# Patient Record
Sex: Female | Born: 1975 | Hispanic: Yes | Marital: Married | Smoking: Never smoker
Health system: Southern US, Community
[De-identification: ages and names within clinical notes are randomized; demographics above are authoritative.]

## PROBLEM LIST (undated history)

## (undated) DIAGNOSIS — E7800 Pure hypercholesterolemia, unspecified: Secondary | ICD-10-CM

## (undated) DIAGNOSIS — E559 Vitamin D deficiency, unspecified: Secondary | ICD-10-CM

---

## 2004-03-16 ENCOUNTER — Ambulatory Visit: Payer: Self-pay | Admitting: Family Medicine

## 2004-09-19 ENCOUNTER — Emergency Department: Payer: Self-pay | Admitting: Emergency Medicine

## 2013-07-10 ENCOUNTER — Emergency Department: Payer: Self-pay | Admitting: Emergency Medicine

## 2013-07-10 LAB — COMPREHENSIVE METABOLIC PANEL
ALBUMIN: 4 g/dL (ref 3.4–5.0)
ALK PHOS: 75 U/L
ANION GAP: 8 (ref 7–16)
BUN: 11 mg/dL (ref 7–18)
Bilirubin,Total: 0.6 mg/dL (ref 0.2–1.0)
Calcium, Total: 8.9 mg/dL (ref 8.5–10.1)
Chloride: 103 mmol/L (ref 98–107)
Co2: 28 mmol/L (ref 21–32)
Creatinine: 0.69 mg/dL (ref 0.60–1.30)
EGFR (Non-African Amer.): >60
Glucose: 99 mg/dL (ref 65–99)
Osmolality: 277 (ref 275–301)
Potassium: 3.7 mmol/L (ref 3.5–5.1)
SGOT(AST): 28 U/L (ref 15–37)
SGPT (ALT): 44 U/L (ref 12–78)
Sodium: 139 mmol/L (ref 136–145)
Total Protein: 8.1 g/dL (ref 6.4–8.2)

## 2013-07-10 LAB — CBC WITH DIFFERENTIAL/PLATELET
BASOS PCT: 0.4 %
Basophil #: 0 10*3/uL (ref 0.0–0.1)
EOS ABS: 0.1 10*3/uL (ref 0.0–0.7)
EOS PCT: 1.1 %
HCT: 40.6 % (ref 35.0–47.0)
HGB: 14 g/dL (ref 12.0–16.0)
LYMPHS PCT: 23.5 %
Lymphocyte #: 1.6 10*3/uL (ref 1.0–3.6)
MCH: 29.2 pg (ref 26.0–34.0)
MCHC: 34.5 g/dL (ref 32.0–36.0)
MCV: 85 fL (ref 80–100)
MONOS PCT: 7.8 %
Monocyte #: 0.5 x10 3/mm (ref 0.2–0.9)
NEUTROS PCT: 67.2 %
Neutrophil #: 4.7 10*3/uL (ref 1.4–6.5)
Platelet: 226 10*3/uL (ref 150–440)
RBC: 4.81 10*6/uL (ref 3.80–5.20)
RDW: 12.9 % (ref 11.5–14.5)
WBC: 7 10*3/uL (ref 3.6–11.0)

## 2013-07-10 LAB — URINALYSIS, COMPLETE
BLOOD: NEGATIVE
Bacteria: NEGATIVE
Bilirubin,UR: NEGATIVE
Glucose,UR: NEGATIVE mg/dL (ref 0–75)
KETONE: NEGATIVE
LEUKOCYTE ESTERASE: NEGATIVE
NITRITE: NEGATIVE
Ph: 6 (ref 4.5–8.0)
Protein: NEGATIVE
RBC, UR: NONE SEEN /HPF (ref 0–5)
Specific Gravity: 1.01 (ref 1.003–1.030)

## 2014-06-19 ENCOUNTER — Emergency Department: Payer: Self-pay

## 2014-06-19 ENCOUNTER — Emergency Department
Admission: EM | Admit: 2014-06-19 | Discharge: 2014-06-19 | Disposition: A | Discharge status: Home / Self Care | Payer: Self-pay | Attending: Emergency Medicine | Admitting: Emergency Medicine

## 2014-06-19 DIAGNOSIS — R519 Headache, unspecified: Secondary | ICD-10-CM

## 2014-06-19 DIAGNOSIS — G510 Bell's palsy: Secondary | ICD-10-CM | POA: Insufficient documentation

## 2014-06-19 DIAGNOSIS — R51 Headache: Secondary | ICD-10-CM

## 2014-06-19 HISTORY — DX: Vitamin D deficiency, unspecified: E55.9

## 2014-06-19 HISTORY — DX: Pure hypercholesterolemia, unspecified: E78.00

## 2014-06-19 LAB — COMPREHENSIVE METABOLIC PANEL
ALT: 11 U/L — AB (ref 14–54)
AST: 22 U/L (ref 15–41)
Albumin: 4.5 g/dL (ref 3.5–5.0)
Alkaline Phosphatase: 63 U/L (ref 38–126)
Anion gap: 7 (ref 5–15)
BILIRUBIN TOTAL: 0.7 mg/dL (ref 0.3–1.2)
BUN: 9 mg/dL (ref 6–20)
CHLORIDE: 106 mmol/L (ref 101–111)
CO2: 25 mmol/L (ref 22–32)
Calcium: 9.2 mg/dL (ref 8.9–10.3)
Creatinine, Ser: 0.71 mg/dL (ref 0.44–1.00)
GFR calc non Af Amer: >60 mL/min (ref >60)
GLUCOSE: 115 mg/dL — AB (ref 65–99)
Potassium: 3.8 mmol/L (ref 3.5–5.1)
Sodium: 138 mmol/L (ref 135–145)
Total Protein: 7.9 g/dL (ref 6.5–8.1)

## 2014-06-19 LAB — CBC WITH DIFFERENTIAL/PLATELET
Basophils Absolute: 0 10*3/uL (ref 0–0.1)
Basophils Relative: 0 %
EOS ABS: 0.1 10*3/uL (ref 0–0.7)
EOS PCT: 1 %
HCT: 40 % (ref 35.0–47.0)
Hemoglobin: 13.7 g/dL (ref 12.0–16.0)
LYMPHS ABS: 1.5 10*3/uL (ref 1.0–3.6)
Lymphocytes Relative: 20 %
MCH: 28.6 pg (ref 26.0–34.0)
MCHC: 34.2 g/dL (ref 32.0–36.0)
MCV: 83.6 fL (ref 80.0–100.0)
MONO ABS: 0.5 10*3/uL (ref 0.2–0.9)
MONOS PCT: 6 %
NEUTROS PCT: 73 %
Neutro Abs: 5.5 10*3/uL (ref 1.4–6.5)
PLATELETS: 241 10*3/uL (ref 150–440)
RBC: 4.78 MIL/uL (ref 3.80–5.20)
RDW: 13.1 % (ref 11.5–14.5)
WBC: 7.5 10*3/uL (ref 3.6–11.0)

## 2014-06-19 MED ORDER — VALACYCLOVIR HCL 1 G PO TABS: 1000.0000 mg | ORAL_TABLET | Freq: Three times a day (TID) | ORAL | Status: AC

## 2014-06-19 MED ORDER — METOCLOPRAMIDE HCL 5 MG/ML IJ SOLN
INTRAMUSCULAR | Status: AC
  Filled 2014-06-19: qty 2

## 2014-06-19 MED ORDER — KETOROLAC TROMETHAMINE 30 MG/ML IJ SOLN
INTRAMUSCULAR | Status: AC
  Filled 2014-06-19: qty 1

## 2014-06-19 MED ORDER — KETOROLAC TROMETHAMINE 30 MG/ML IJ SOLN: 30.0000 mg | Freq: Once | INTRAMUSCULAR | Status: DC

## 2014-06-19 MED ORDER — METOCLOPRAMIDE HCL 5 MG/ML IJ SOLN: 10.0000 mg | Freq: Once | INTRAMUSCULAR | Status: DC

## 2014-06-19 MED ORDER — PREDNISONE 10 MG PO TABS: 10.0000 mg | ORAL_TABLET | Freq: Every day | ORAL | Status: AC

## 2014-06-19 NOTE — ED Notes (Signed)
Headache since last Saturday, tightness to left side of face. No slurred speech or change in mental status. Grip strength equal, smile symmetrical. Pt alert and oriented X4, active, cooperative. RR even and unlabored, color WNL.  Pt tearful. Sent from [facility].

## 2014-06-19 NOTE — ED Provider Notes (Signed)
[facility] Emergency Department Provider Note     Time seen: ----------------------------------------- 10:33 AM on 06/19/2014 -----------------------------------------    I have reviewed the triage vital signs and the nursing notes.   HISTORY  Chief Complaint Headache    HPI Kristy Collins is a 39 y.o. female who presents ER for headaches since last Saturday, she notes some tightness to the left side of her face. She states the headache has come and gone for the last week and she was sent from [facility] due to same. She describes some tightness on the left side of her face, feels numb but she still has sensation to the left side of her face. She denies any history of this, but does take medication from time to time for her headaches. Denies any other complaints. Headache is currently subsided, still has left-sided facial tightness that is mild to moderate   Past Medical History  Diagnosis Date  . Vitamin D deficiency   . Hypercholesteremia     There are no active problems to display for this patient.   Past Surgical History  Procedure Laterality Date  . Cesarean section      Allergies Review of patient's allergies indicates no known allergies.  Social History History  Substance Use Topics  . Smoking status: Never Smoker   . Smokeless tobacco: Not on file  . Alcohol Use: No    Review of Systems Constitutional: Negative for fever. Eyes: Negative for visual changes. ENT: Negative for sore throat. Cardiovascular: Negative for chest pain. Respiratory: Negative for shortness of breath. Gastrointestinal: Negative for abdominal pain, vomiting and diarrhea. Genitourinary: Negative for dysuria. Musculoskeletal: Negative for back pain. Skin: Negative for rash. Neurological: Positive headache and left facial numbness  10-point ROS otherwise negative.  ____________________________________________   PHYSICAL EXAM:  VITAL  SIGNS: ED Triage Vitals  Enc Vitals Group     BP 06/19/14 1029 128/87 mmHg     Pulse Rate 06/19/14 1027 93     Resp 06/19/14 1027 18     Temp 06/19/14 1027 98.6 F (37 C)     Temp Source 06/19/14 1027 Oral     SpO2 06/19/14 1027 100 %     Weight 06/19/14 1027 200 lb (90.719 kg)     Height 06/19/14 1027  (1.651 m)     Head Cir --      Peak Flow --      Pain Score --      Pain Loc --      Pain Edu? --      Excl. in GC? --     Constitutional: Alert and oriented. Well appearing and in no distress. Eyes: Mild conjunctival erythema on the left, PERRL. Normal extraocular movements. ENT   Head: Normocephalic and atraumatic.   Nose: No congestion/rhinnorhea.   Mouth/Throat: Mucous membranes are moist.   Neck: No stridor. Hematological/Lymphatic/Immunilogical: No cervical lymphadenopathy. Cardiovascular: Normal rate, regular rhythm. Normal and symmetric distal pulses are present in all extremities. No murmurs, rubs, or gallops. Respiratory: Normal respiratory effort without tachypnea nor retractions. Breath sounds are clear and equal bilaterally. No wheezes/rales/rhonchi. Gastrointestinal: Soft and nontender. No distention. No abdominal bruits. There is no CVA tenderness. Musculoskeletal: Nontender with normal range of motion in all extremities. No joint effusions.  No lower extremity tenderness nor edema. Neurologic:  Normal speech and language. There is some difficulty with raising of the left eyebrow, paresthesias noted left side of face. Skin:  Skin is warm, dry and intact. No  rash noted. Psychiatric: Mood and affect are normal. Speech and behavior are normal. Patient exhibits appropriate insight and judgment. ____________________________________________  ED COURSE:  Pertinent labs & imaging results that were available during my care of the patient were reviewed by me and considered in my medical decision making (see chart for details). We'll give typical headache  cocktail, CT head and reevaluate. ____________________________________________    LABS (pertinent positives/negatives)  Labs Reviewed  COMPREHENSIVE METABOLIC PANEL - Abnormal; Notable for the following:    Glucose, Bld 115 (*)    ALT 11 (*)    All other components within normal limits  CBC WITH DIFFERENTIAL/PLATELET    RADIOLOGY  CT head FINDINGS: No evidence of acute infarction, mass lesion, hemorrhage, hydrocephalus or extra-axial collection. There is a benign appearing 2.5 mm calcification in the right caudate, most likely the result of previous inflammatory disease such as neurocysticercosis. This would likely be incidental at this point. The calvarium is normal. Sinuses, middle ears and mastoids are clear.  IMPRESSION: No acute finding. No cause of headache identified. 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 ____________________________________________  FINAL ASSESSMENT AND PLAN  Headache and facial paresthesias  Plan: Exam reveals likely early Bell's palsy in addition to headaches. Patient improved here, remains with some weakness with the left eyebrow, otherwise neuro intact. We'll discharge with prednisone taper and valacyclovir covering her for Bell's palsy. She is encouraged to have close follow-up with her clinic in the next 48 hours for reevaluation.   Emily Filbert, MD   Emily Filbert, MD 06/19/14 [phone]

## 2014-06-19 NOTE — Discharge Instructions (Signed)
Dolor de cabeza general sin causa  (General Headache Without Cause)  Un dolor de cabeza en general es un dolor o malestar que se siente en la zona de la cabeza o del cuello. Se desconocen las causas.  CUIDADOS EN EL HOGAR   Cumpla con los controles mdicos segn las indicaciones.  Tome slo los medicamentos que le haya indicado el mdico.  Cuando sienta dolor de cabeza acustese en un cuarto oscuro y tranquilo.  Lleve un registro diario para averiguar si ciertas cosas provocan dolores de cabeza. Por ejemplo, escriba:  Lo que come y bebe.  Cunto tiempo duerme.  Todo cambio en la dieta o medicamentos.  Reljese recibiendo masajes o haga otras actividades relajantes.  Coloque hielo o calor en la cabeza y el cuello como lo indique su mdico.  DISMINUYA EL NIVEL DE ESTRS  Sintase con la espalda recta. No apriete los msculos (tensione).  Si fuma, deje de hacerlo.  Beba menos alcohol.  Consuma menos cafena o deje de tomarla.  Coma y duerma en horarios regulares.  Duerma entre 7 y 9 horas o como le indique su mdico.  Dietitian las luces tenues si le [employer] luces brillantes o sus dolores de cabeza empeoran. SOLICITE AYUDA DE INMEDIATO SI:   El dolor de [place].  Tiene fiebre.  Presenta rigidez en el cuello.  Tiene dificultad para ver.  Sus msculos estn dbiles o pierde el control muscular.  Pierde equilibrio o tiene problemas para Advertising account planner.  Siente que se desvanece (debilidad) o se desmaya.  Tiene sntomas intensos que son diferentes a los primeros sntomas.  Tiene problemas con los medicamentos que le recet su mdico.  El medicamento no le hace efecto.  Siente que el dolor de [place] es diferente a otros dolores de [place].  Tiene malestar estomacal (nuseas) o (vmitos). ASEGRESE DE QUE:   Comprende estas instrucciones.  Controlar su enfermedad.  Solicitar ayuda de inmediato si no mejora o si empeora. Document Released:  03/19/2011 [facility] Patient Information 2015 [place], [place]. This information is not intended to replace advice given to you by your health care provider. Make sure you discuss any questions you have with your health care provider.  Parlisis de Armed forces logistics/support/administrative officer (Bell's Palsy) La parlisis de Bell es un trastorno en el que los msculos de un lado de la cara no pueden moverse (parlisis). Esto se debe a que los nervios de la cara estn paralizados. Se cree que es producido por un virus. El virus causa inflamacin del nervio que controla los movimientos de un lado de la cara. El nervio pasa a travs de un espacio angosto, rodeado de [place]. Cuando el nervio se inflama, el hueso puede comprimirlo. Esto da [employer] un dao a la cubierta protectora que rodea [place]. Este dao interfiere con la forma en la que el nervio se comunica con los msculos de la cara. Como [place], puede causar debilidad o parlisis en los msculos faciales.  Las lesiones (traumatismos), tumores y [place] pueden causar parlisis de Armed forces logistics/support/administrative officer, pero la [place] de las veces se desconoce la causa. Es un trastorno bastante frecuente. Comienza de repente (aparicin abrupta) y la parlisis por lo general desaparece en 2 das. La parlisis de Bell no es peligrosa. Pero como el ojo no se cierra adecuadamente, necesitar tomar algunos recaudos para que no se seque. Esto puede incluir un vendaje (mantener el ojo cerrado) o humedecerlo con Biomedical scientist. Rara vez ocurre en ambos lados al [employer]. SNTOMAS  Ceja cada.  Cada del  ojo y comisura de la boca.  Incapacidad para cerrar un ojo.  Prdida del sentido del gusto en la parte anterior de la lengua.  Sensibilidad a ruidos fuertes. TRATAMIENTO El tratamiento por lo general no es quirrgico. Si el paciente fue atendido dentro de las primeras 24 a 48 horas puede que se le haya prescrito un tratamiento corto con esteroides para intentar acortar el curso de la enfermedad. Tambin se  podrn Chemical engineer medicamentos antivirales junto con los esteroides, pero no est claro si son tiles.  Necesitar proteger el ojo si no puede cerrarlo. La crnea (cubierta transparente del ojo) podra secarse y daarse. Se podrn utilizar lgrimas artificiales para mantener el ojo lubricado. Se debern utilizar lentes o parches para proteger el ojo. PRONSTICO El tiempo de recuperacin es variable y puede tardar desde [address] a varios meses. Aunque en general se cura completamente, (en un 80% de los casos aproximadamente), las consecuencias no pueden predecirse. La [employer] de las personas mejoran dentro de las 3 semanas del comienzo de los sntomas. Las mejoras debern continuar por entre 3 y 6 meses. Una pequea cantidad de personas presentan debilidad moderada a grave que es [place].  INSTRUCCIONES PARA EL CUIDADO DOMICILIARIO  Si su mdico le prescribe medicamentos para controlar la inflamacin del nervio, tmela de la manera indicada. No suspenda los medicamentos a menos que se lo haya indicado el profesional que le asiste.  Utilice gotas oculares para [employer] ojos y prevenir la sequedad, de la manera en que se le indique.  Proteja sus ojos de la [employer] se lo indique el profesional que le asiste.  Practique masajes faciales y ejercicios de la manera que se lo indique el profesional que le asiste.  Realice sus actividades normales y procure un descanso regular. SOLICITE ATENCIN MDICA DE INMEDIATO SI:  Presenta dolor, enrojecimiento o irritacin en el ojo.  Usted o su nio tienen una temperatura oral de ms de 38,9 C (102 F) y no puede controlarla con medicamentos. EST SEGURO QUE:   Comprende las instrucciones para el alta mdica.  Controlar su enfermedad.  Solicitar atencin mdica de inmediato segn las indicaciones. Document Released: 12/25/2004 Document Revised: 03/19/2011 [facility] Patient Information 2015 [place], [place]. This information is not intended  to replace advice given to you by your health care provider. Make sure you discuss any questions you have with your health care provider.

## 2014-06-22 ENCOUNTER — Emergency Department
Admission: EM | Admit: 2014-06-22 | Discharge: 2014-06-22 | Disposition: A | Discharge status: Home / Self Care | Payer: Self-pay | Attending: Emergency Medicine | Admitting: Emergency Medicine

## 2014-06-22 DIAGNOSIS — Z79899 Other long term (current) drug therapy: Secondary | ICD-10-CM | POA: Insufficient documentation

## 2014-06-22 DIAGNOSIS — G510 Bell's palsy: Secondary | ICD-10-CM

## 2014-06-22 DIAGNOSIS — R202 Paresthesia of skin: Secondary | ICD-10-CM | POA: Insufficient documentation

## 2014-06-22 DIAGNOSIS — Z7952 Long term (current) use of systemic steroids: Secondary | ICD-10-CM | POA: Insufficient documentation

## 2014-06-22 MED ORDER — CYCLOBENZAPRINE HCL 10 MG PO TABS
10.0000 mg | ORAL_TABLET | Freq: Once | ORAL | Status: AC
  Administered 2014-06-22: 10 mg via ORAL

## 2014-06-22 MED ORDER — CYCLOBENZAPRINE HCL 10 MG PO TABS
ORAL_TABLET | ORAL | Status: AC
  Filled 2014-06-22: qty 1

## 2014-06-22 MED ORDER — CYCLOBENZAPRINE HCL 10 MG PO TABS: 10.0000 mg | ORAL_TABLET | Freq: Three times a day (TID) | ORAL | Status: AC | PRN

## 2014-06-22 NOTE — ED Notes (Signed)
Pt ambulatory to triage without difficulty or distress noted; pt seen at [facility] Saturday, sent here for further eval and dx with inflammed nerve in face, rx  Valtrex and prednisone But having persistent "numbness and pulling to face"

## 2014-06-22 NOTE — ED Notes (Signed)
Pt signed physical paperwork.

## 2014-06-22 NOTE — ED Provider Notes (Signed)
[facility] Emergency Department Provider Note  ____________________________________________  Time seen: 2204 I have reviewed the triage vital signs and the nursing notes.   HISTORY  Chief Complaint No chief complaint on file.    HPI Kristy Collins is a 39 y.o. female complaining ofnumbness and pulling to her face was seen here on Saturday diagnosed with Bell's palsy states that symptoms are still there she's not improving she is concerned moving to the other side of the face also complains of a mild headache denies any other complaints this time fever chills nausea vomiting and as of note she said she didn't tell the last time that she has been injecting herself with 50,000 mg of B vitamins that were sent over from [place]   Past Medical History  Diagnosis Date  . Vitamin D deficiency   . Hypercholesteremia     There are no active problems to display for this patient.   Past Surgical History  Procedure Laterality Date  . Cesarean section      Current Outpatient Rx  Name  Route  Sig  Dispense  Refill  . cyclobenzaprine (FLEXERIL) 10 MG tablet   Oral   Take 1 tablet (10 mg total) by mouth every 8 (eight) hours as needed for muscle spasms.   12 tablet   0   . ibuprofen (ADVIL,MOTRIN) 600 MG tablet   Oral   Take 600 mg by mouth every 6 (six) hours as needed for headache or moderate pain.         . Multiple Vitamin (MULTIVITAMIN) tablet   Oral   Take 1 tablet by mouth daily.         . predniSONE (DELTASONE) 10 MG tablet   Oral   Take 1 tablet (10 mg total) by mouth daily. 60 mg daily for 2 days, then 50 mg daily for 2 days, then 40 mg daily for 2 days, then 30 mg for 2 days, then 20 mg daily for 2 days, then 10 mg daily for 2 days   30 tablet   0   . valACYclovir (VALTREX) 1000 MG tablet   Oral   Take 1 tablet (1,000 mg total) by mouth 3 (three) times daily.   21 tablet   0     Allergies Review of patient's allergies indicates  no known allergies.  No family history on file.  Social History History  Substance Use Topics  . Smoking status: Never Smoker   . Smokeless tobacco: Not on file  . Alcohol Use: No    Review of Systems Constitutional: No fever/chills Eyes: No visual changes. ENT: No sore throat. Cardiovascular: Denies chest pain. Respiratory: Denies shortness of breath. Gastrointestinal: No abdominal pain.  No nausea, no vomiting.  No diarrhea.  No constipation. Genitourinary: Negative for dysuria. Musculoskeletal: Negative for back pain. Skin: Negative for rash. Neurological: Negative for headaches, focal weakness or numbness. Except for previously noted in history of present illness  10-point ROS otherwise negative. Except for noted in the history of present illness  ____________________________________________   PHYSICAL EXAM:  VITAL SIGNS: ED Triage Vitals  Enc Vitals Group     BP 06/22/14 2148 135/104 mmHg     Pulse Rate 06/22/14 2148 100     Resp 06/22/14 2148 18     Temp 06/22/14 2148 97.6 F (36.4 C)     Temp Source 06/22/14 2148 Oral     SpO2 06/22/14 2148 100 %     Weight 06/22/14 2148 200 lb (  90.719 kg)     Height 06/22/14 2148  (1.651 m)     Head Cir --      Peak Flow --      Pain Score --      Pain Loc --      Pain Edu? --      Excl. in GC? --     Constitutional: Alert and oriented. Well appearing and in no acute distress. Eyes: Conjunctivae are normal. PERRL. EOMI. Head: Atraumatic. Does seem to have a mild facial palsy on the left side Nose: No congestion/rhinnorhea. Mouth/Throat: Mucous membranes are moist.  Oropharynx non-erythematous. Neck: No stridor.   Cardiovascular: Normal rate, regular rhythm. Grossly normal heart sounds.  Good peripheral circulation. Respiratory: Normal respiratory effort.  No retractions. Lungs CTAB. Musculoskeletal: No lower extremity tenderness nor edema.  No joint effusions. Neurologic:  Normal speech and language. Mild  facial palsy on the left side Speech is normal. No gait instability. Skin:  Skin is warm, dry and intact. No rash noted. Psychiatric: Mood and affect are normal. Speech and behavior are normal.      PROCEDURES  Procedure(s) performed: None  Critical Care performed: No  ____________________________________________   INITIAL IMPRESSION / ASSESSMENT AND PLAN / ED COURSE  Pertinent labs & imaging results that were available during my care of the patient were reviewed by me and considered in my medical decision making (see chart for details).  Initial impression on this patient bells palsy paresthesia muscle spasm after review with this patient I'm concerned that she is having symptoms from self injections of B vitamins which she has been taking in large quantities that were sent for from [place] and unregulated amounts discussed with her that symptomatically she still does appear like a Bell's palsy but I did recommend that she avoid any more shots are injections return here for any acute concerns or worsening symptoms was given prescription for Flexeril ____________________________________________   FINAL CLINICAL IMPRESSION(S) / ED DIAGNOSES  Final diagnoses:  Paresthesia  Facial paralysis/Bells palsy     Mell Mellott Rosalyn Gess, PA-C 06/22/14 2306  Loleta Rose, MD 06/23/14 [phone]

## 2014-09-11 ENCOUNTER — Encounter: Payer: Self-pay | Admitting: Emergency Medicine

## 2014-09-11 ENCOUNTER — Emergency Department
Admission: EM | Admit: 2014-09-11 | Discharge: 2014-09-11 | Disposition: A | Discharge status: Home / Self Care | Payer: Self-pay | Attending: Emergency Medicine | Admitting: Emergency Medicine

## 2014-09-11 DIAGNOSIS — Y9289 Other specified places as the place of occurrence of the external cause: Secondary | ICD-10-CM | POA: Insufficient documentation

## 2014-09-11 DIAGNOSIS — Y998 Other external cause status: Secondary | ICD-10-CM | POA: Insufficient documentation

## 2014-09-11 DIAGNOSIS — Z79899 Other long term (current) drug therapy: Secondary | ICD-10-CM | POA: Insufficient documentation

## 2014-09-11 DIAGNOSIS — X58XXXA Exposure to other specified factors, initial encounter: Secondary | ICD-10-CM | POA: Insufficient documentation

## 2014-09-11 DIAGNOSIS — Y9389 Activity, other specified: Secondary | ICD-10-CM | POA: Insufficient documentation

## 2014-09-11 DIAGNOSIS — M62838 Other muscle spasm: Secondary | ICD-10-CM

## 2014-09-11 DIAGNOSIS — S161XXA Strain of muscle, fascia and tendon at neck level, initial encounter: Secondary | ICD-10-CM | POA: Insufficient documentation

## 2014-09-11 NOTE — ED Notes (Signed)
Waiting on interpretor for d/c.  

## 2014-09-11 NOTE — ED Notes (Signed)
Per interpreter, pt repors 2 weeks ago having sharp pain behind right ear. Pt reports 1 week later, pain resided on right side and moved to back of head. Pt reports pain then moved yesterday behind left ear; pt reports increased pain today, took ibuprofen and has helped pain. Pt reports seeing PCP yesterday, was told it was nerve. Pt started taking flexeril lastnight, denies any improvement.

## 2014-09-11 NOTE — ED Notes (Signed)
PA at bedside along with interpretor at this time

## 2014-09-11 NOTE — Discharge Instructions (Signed)
Distensin cervical (Cervical Sprain) La distensin cervical se produce cuando los tejidos (ligamentos) del cuello se estiran o se rompen. CUIDADOS EN EL HOGAR   Aplique hielo sobre la zona lesionada.  Ponga el hielo en una bolsa plstica.  Colquese una toalla entre la piel y la bolsa de hielo.  Deje el hielo durante 15 - 20 minutos y aplquelo 3 - 4 veces por Futures trader.  Es posible que le hayan indicado el uso de un collarn. Este collarn impide que el cuello se mueva mientras se [place].  No se lo quite excepto que se lo indique el mdico.  Si tiene el cabello largo, mantngalo fuera del collarn.  Consulte a su mdico antes de cambiar la posicin del collarn. Es posible que necesite cambiar la posicin con el tiempo para sentirse ms cmodo.  Si le permiten quitarse el collarn para lavarlo o darse un bao, siga las indicaciones de su mdico acerca de cmo hacerlo con seguridad.  Mantenga el collarn limpio pasando un pao con agua y [place]. Squelo bien. Si el collarn tiene almohadillas removibles, qutelas cada 1-2 das para lavarlas a mano con agua y [place]. Deje que se sequen al aire. Debe secarlas bien antes de volver a colocarlas en el collarn.  No conduzca vehculos mientras [place] el collarn.  Slo tome los medicamentos que le haya indicado su mdico.  Cumpla con las visitas al mdico segn las indicaciones.  Cumpla con las sesiones de fisioterapia, segn las indicaciones.  Ajuste su mesa de trabajo de modo que tenga una buena postura al trabajar.  Evite las posiciones y actividades que [employer] sntomas.  Haga un precalentamiento y elongacin adecuados antes de la [place]. SOLICITE AYUDA SI:  El dolor no se alivia con los [employer].  No puede disminuir las dosis de medicamentos para el dolor luego de un tiempo, segn lo planificado.  Su nivel de actividad no mejora segn lo esperado. SOLICITE AYUDA DE INMEDIATO SI:   Tiene sangrado.  Siente Restaurant manager, fast food.  Tiene alguna reaccin a los medicamentos.  Tiene sntomas nuevos que no puede explicar.  Pierde la sensibilidad (adormecimiento) o no Therapist, nutritional del cuerpo (parlisis).  Siente hormigueo o debilidad en alguna parte del cuerpo.  Los sntomas empeoran. Los sntomas son:  Dolor, sensibilidad, rigidez, inflamacin(hinchazn), o sensacin de ardor en el cuello.  Siente dolor cuando le tocan el cuello.  Dolor en el hombro o la zona superior de la espalda.  Capacidad limitada para mover el cuello.  Dolor de [place].  Mareos.  Debilidad, falta de sensibilidad o sensacin hormigueos en las manos o los brazos.  Espasmos musculares.  Dificultades para tragar o comer. ASEGRESE DE QUE:   Comprende estas instrucciones.  Controlar su afeccin.  Recibir ayuda de inmediato si no mejora o si empeora. Document Released: 12/14/2010 Document Revised: 08/27/2012 [facility] Patient Information 2015 [place], [place]. This information is not intended to replace advice given to you by your health care provider. Make sure you discuss any questions you have with your health care provider.  Calambres y espasmos musculares  (Muscle Cramps and Spasms)  Los calambres musculares y espasmos ocurren cuando un msculo o grupos de msculos se tensan y no se tiene control sobre esta tensin (contraccin muscular involuntaria). Es un problema comn y Software engineer en cualquier msculo. La zona ms comn son los msculos de la pantorrilla. Tanto los [employer] espasmos son contracciones musculares involuntarias, pero tambin tienen diferencias:   Los calambres musculares son  espordicos y dolorosos. Pueden durar entre algunos segundos hasta un cuarto de [place]. Los calambres musculares son ms fuertes y duran ms que los espasmos musculares.  Los espasmos pueden o no ser dolorosos. Pueden durar algunos segundos o mucho ms. CAUSAS  No es frecuente que los calambres se deban a un  trastorno subyacente grave. En muchos casos, la causa de los calambres y los espasmos es desconocida. Algunas causas frecuentes son:   Esfuerzo excesivo.   El uso excesivo del msculo por movimientos repetitivos (hacer lo mismo una y Laverda Page).   Permanecer en cierta posicin durante un largo perodo de [place].   Preparacin, forma o tcnica inadecuada al realizar un deporte o [place].   Deshidratacin.   Traumatismos.   Efectos secundarios de algunos medicamentos.  Niveles anormalmente bajos de las sales e iones en la sangre (electrolitos), especialmente el potasio y el calcio. Pueden ocurrir cuando se toman pldoras para Geographical information systems officer (diurticos) o en las mujeres embarazadas.  Algunos problemas mdicos subyacentes pueden hacer que sea ms propenso a desarrollar calambres o espasmos. Estos incluyen, pero no se limitan a:   Diabetes.   Enfermedad de Parkinson.   Trastornos hormonales, tales como problemas de la tiroides.   El consumo excesivo de alcohol.   Enfermedades especficas de [employer], las articulaciones y [place].   Enfermedad vascular en la que no llega suficiente sangre a los msculos.  INSTRUCCIONES PARA EL CUIDADO EN EL HOGAR   Mantngase bien hidratado. Beba gran cantidad de lquido para mantener la orina de tono claro o color amarillo plido.  Puede ser til Engineer, maintenance (IT), Therapist, music y International aid/development worker el msculo afectado.  Para los msculos tensos o apretados, use una toalla caliente, una almohadilla trmica o agua caliente de la ducha dirigida a la zona afectada.  Si est dolorido o siente dolor despus de un calambre o espasmo, aplique hielo en el rea afectada para Acupuncturist.  Ponga el hielo en una bolsa plstica.  Colquese una toalla entre la piel y la bolsa de hielo.  Deje el hielo en el lugar durante 15 a 20 minutos, 3 a 4 veces por da.  Los medicamentos que se utilizan para tratar las causas conocidas de los calambres o espasmos pueden  reducir su frecuencia o gravedad. Tome slo medicamentos de venta libre o recetados, segn las indicaciones del mdico. SOLICITE ATENCIN MDICA SI:  Los calambres o espasmos empeoran, ocurren con ms frecuencia o no mejoran con Museum/gallery conservator.  ASEGRESE DE QUE:   Comprende estas instrucciones.  Controlar su enfermedad.  Solicitar ayuda de inmediato si no mejora o si empeora. Document Released: 10/04/2004 Document Revised: 04/21/2012 [facility] Patient Information 2015 [place], [place]. This information is not intended to replace advice given to you by your health care provider. Make sure you discuss any questions you have with your health care provider.  Your exam is shows symptoms of neck muscle strain and spasms. Take the Cyclobenzaprine as directed. Consider taking 1/2 a tablet as needed. Take the Ibuprofen every 8 hours for pain. Follow-up with [facility] for ongoing symptoms or return to the Emergency Department for worse symptoms.

## 2014-09-11 NOTE — ED Provider Notes (Signed)
[facility] Emergency Department Provider Note ____________________________________________  Time seen: 1405  I have reviewed the triage vital signs and the nursing notes.  HISTORY  Chief Complaint  Headache  History limited by Spanish language. Interpreter Babs Sciara) present during interview and exam.   HPI Kristy Collins is a 39 y.o. female reports to the ED with complaints of continued left neck pain over the last 2 weeks. She reports the pain as "shooting" in nature. Over the last 2 weeks she has been dosing ibuprofen 600 mg as needed. She was recently evaluated by a provider at [facility] yesterday, and was given a prescription for Flexeril 10 mg tablets. She reports that the medication makes her drowsy at the current dose. She reports that the symptoms are primarily at the base of her next, and refer up into the back of the skull. She denies any distal paresthesias, grip changes, or syncope.  Past Medical History  Diagnosis Date  . Vitamin D deficiency   . Hypercholesteremia    There are no active problems to display for this patient.  Past Surgical History  Procedure Laterality Date  . Cesarean section      Current Outpatient Rx  Name  Route  Sig  Dispense  Refill  . cyclobenzaprine (FLEXERIL) 10 MG tablet   Oral   Take 1 tablet (10 mg total) by mouth every 8 (eight) hours as needed for muscle spasms.   12 tablet   0   . ibuprofen (ADVIL,MOTRIN) 600 MG tablet   Oral   Take 600 mg by mouth every 6 (six) hours as needed for headache or moderate pain.         . Multiple Vitamin (MULTIVITAMIN) tablet   Oral   Take 1 tablet by mouth daily.         . predniSONE (DELTASONE) 10 MG tablet   Oral   Take 1 tablet (10 mg total) by mouth daily. 60 mg daily for 2 days, then 50 mg daily for 2 days, then 40 mg daily for 2 days, then 30 mg for 2 days, then 20 mg daily for 2 days, then 10 mg daily for 2 days   30 tablet    0    Allergies Review of patient's allergies indicates no known allergies.  No family history on file.  Social History Social History  Substance Use Topics  . Smoking status: Never Smoker   . Smokeless tobacco: None  . Alcohol Use: No   Review of Systems  Constitutional: Negative for fever. Eyes: Negative for visual changes. ENT: Negative for sore throat. Cardiovascular: Negative for chest pain. Respiratory: Negative for shortness of breath. Gastrointestinal: Negative for abdominal pain, vomiting and diarrhea. Genitourinary: Negative for dysuria. Musculoskeletal: Negative for back pain. Neck pain as above Skin: Negative for rash. Neurological: Negative for headaches, focal weakness or numbness. ____________________________________________  PHYSICAL EXAM:  VITAL SIGNS: ED Triage Vitals  Enc Vitals Group     BP 09/11/14 1316 131/93 mmHg     Pulse Rate 09/11/14 1316 73     Resp 09/11/14 1316 14     Temp 09/11/14 1316 97.9 F (36.6 C)     Temp Source 09/11/14 1316 Oral     SpO2 09/11/14 1316 100 %     Weight 09/11/14 1316 200 lb (90.719 kg)     Height 09/11/14 1316  (1.676 m)     Head Cir --      Peak Flow --  Pain Score 09/11/14 1345 4     Pain Loc --      Pain Edu? --      Excl. in GC? --    Constitutional: Alert and oriented. Well appearing and in no distress. Eyes: Conjunctivae are normal. PERRL. Normal extraocular movements. ENT   Head: Normocephalic and atraumatic.   Nose: No congestion/rhinnorhea.   Mouth/Throat: Mucous membranes are moist.   Neck: Supple. No thyromegaly. Hematological/Lymphatic/Immunilogical: No cervical lymphadenopathy. Cardiovascular: Normal rate, regular rhythm.  Respiratory: Normal respiratory effort. No wheezes/rales/rhonchi. Gastrointestinal: Soft and nontender. No distention. Musculoskeletal: Normal spinal alignment without spasm, deformity, or step-off. Normal cervical ROM except decreased right rotation.  Nontender with normal range of motion in all extremities.  Neurologic: CN II-XII grossly intact. Normal grips bilaterally. Normal gait without ataxia. Normal speech and language. No gross focal neurologic deficits are appreciated. Skin:  Skin is warm, dry and intact. No rash noted. Psychiatric: Mood and affect are normal. Patient exhibits appropriate insight and judgment. ____________________________________________  INITIAL IMPRESSION / ASSESSMENT AND PLAN / ED COURSE  Musculoskeletal cervical strain with intermittent spasms. No indication of any neuromuscular deficit on exam. Reassurance to the patient about the nature and course, and treatment with anti-inflammatories and antispasm Rx. Will continue current medicine Patient is advised to cut dose of Flexeril in half if needed. She is also given advice on ice and heat therapy. Follow with primary provider in one week or return to the ED in the same time frame for worsening symptoms. ____________________________________________  FINAL CLINICAL IMPRESSION(S) / ED DIAGNOSES  Final diagnoses:  Cervical myofascial strain, initial encounter  Muscle spasms of neck     Lissa Hoard, PA-C 09/11/14 1530  Myrna Blazer, MD 09/11/14 [phone]

## 2020-06-22 ENCOUNTER — Other Ambulatory Visit: Payer: Self-pay | Admitting: Family Medicine

## 2020-06-22 DIAGNOSIS — N920 Excessive and frequent menstruation with regular cycle: Secondary | ICD-10-CM

## 2020-08-05 ENCOUNTER — Ambulatory Visit
Admission: RE | Admit: 2020-08-05 | Discharge: 2020-08-05 | Disposition: A | Discharge status: Home / Self Care | Payer: Self-pay | Source: Ambulatory Visit | Attending: Family Medicine | Admitting: Family Medicine

## 2020-08-05 ENCOUNTER — Other Ambulatory Visit: Payer: Self-pay

## 2020-08-05 DIAGNOSIS — N920 Excessive and frequent menstruation with regular cycle: Secondary | ICD-10-CM | POA: Insufficient documentation

## 2023-04-04 IMAGING — US US PELVIS COMPLETE WITH TRANSVAGINAL
1 series · 13 of 25 positions shown · non-contrast
Comparison: None

CLINICAL DATA: Initial evaluation for menorrhagia with regular
cycle.



[Series 1: us pelvic complete with transvaginal · 13 of 64 slices shown]
[im 1/64]
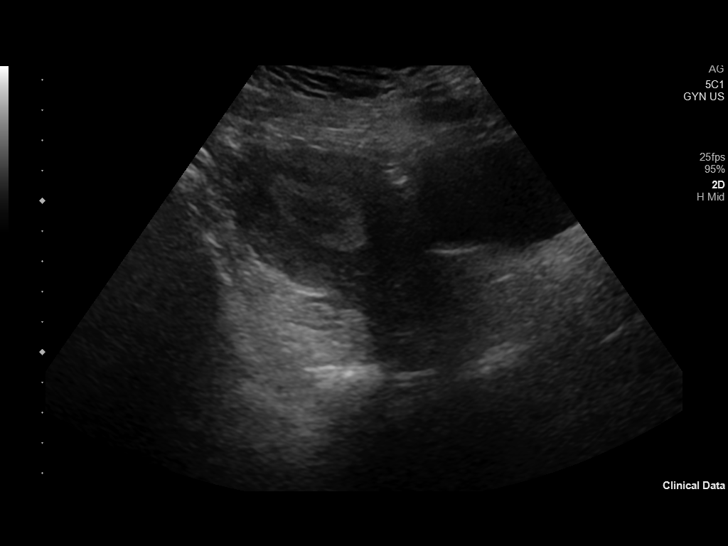
[im 6/64]
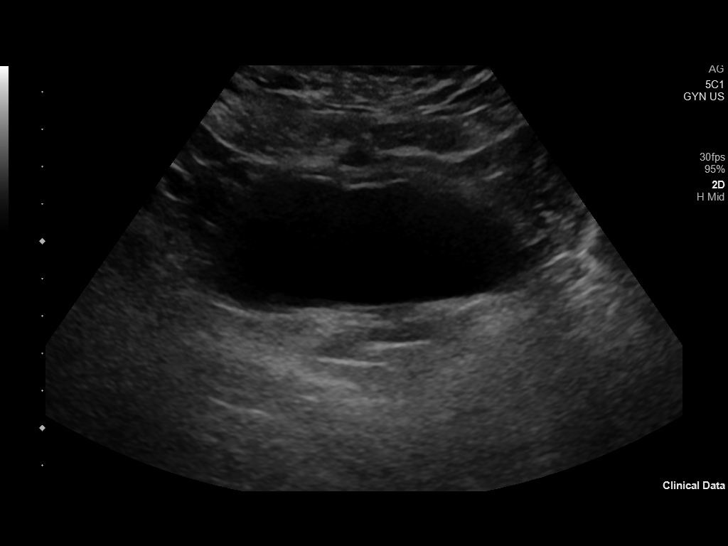
[im 11/64]
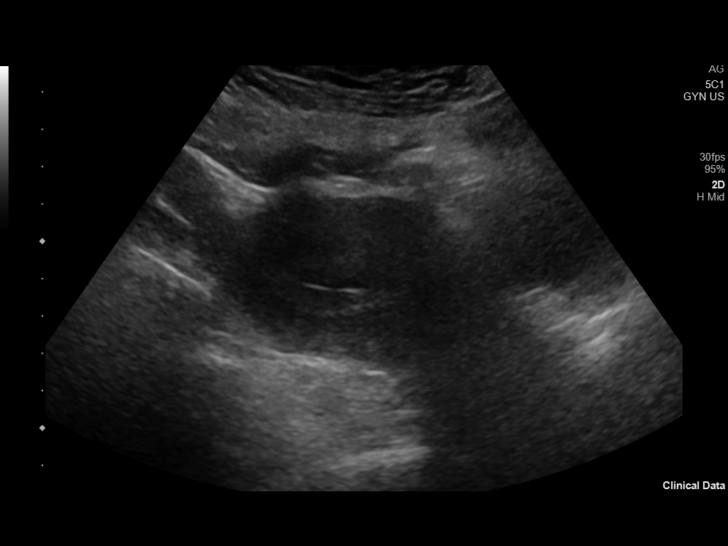
[im 16/64]
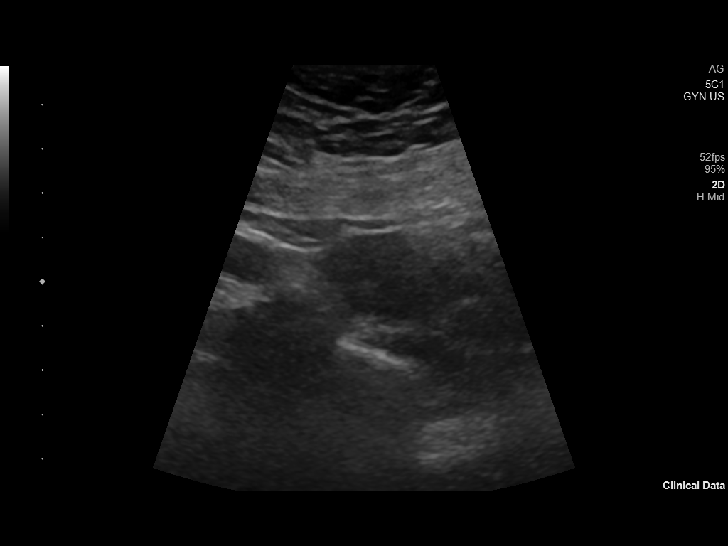
[im 22/64]
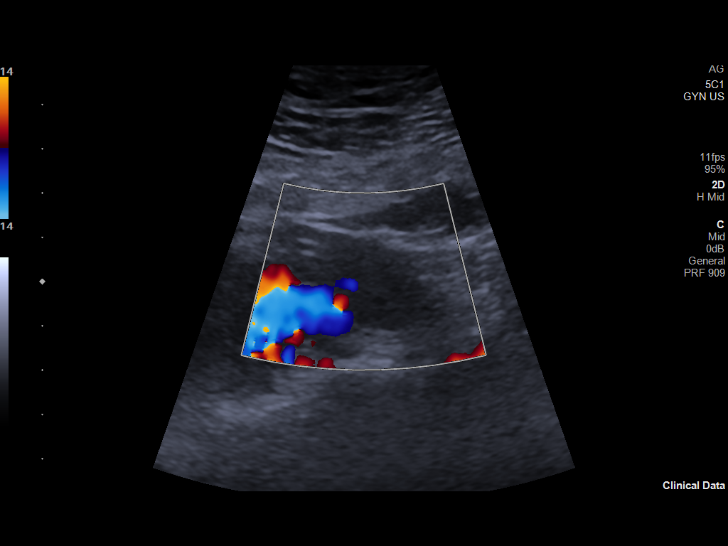
[im 27/64]
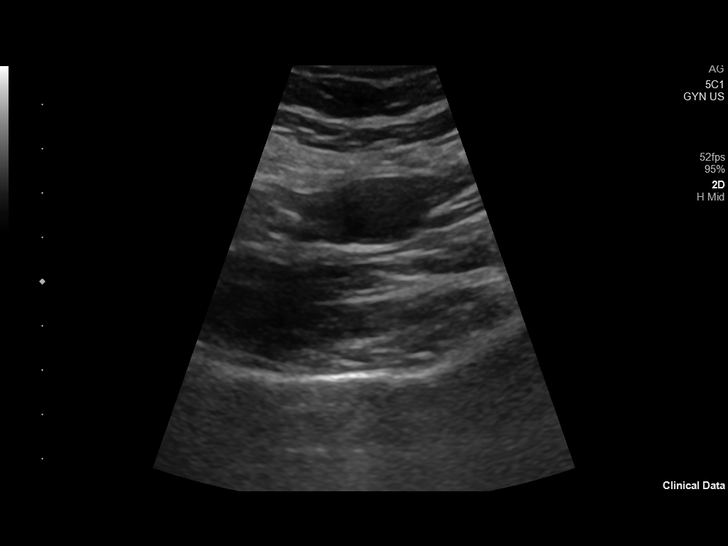
[im 32/64]
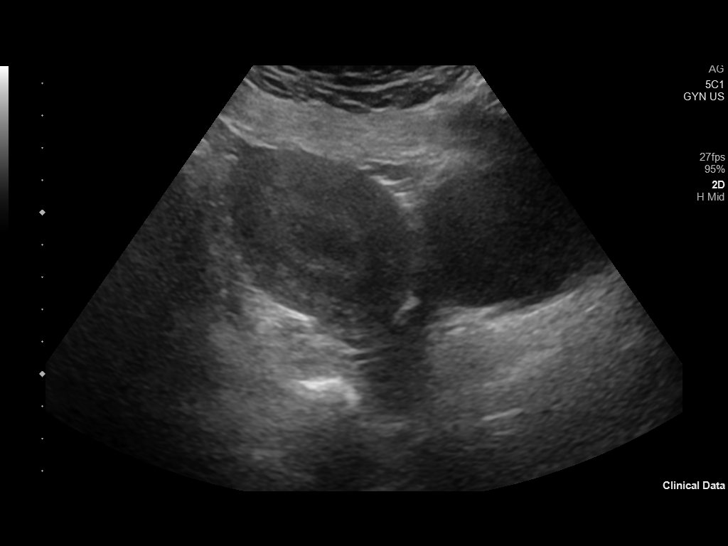
[im 37/64]
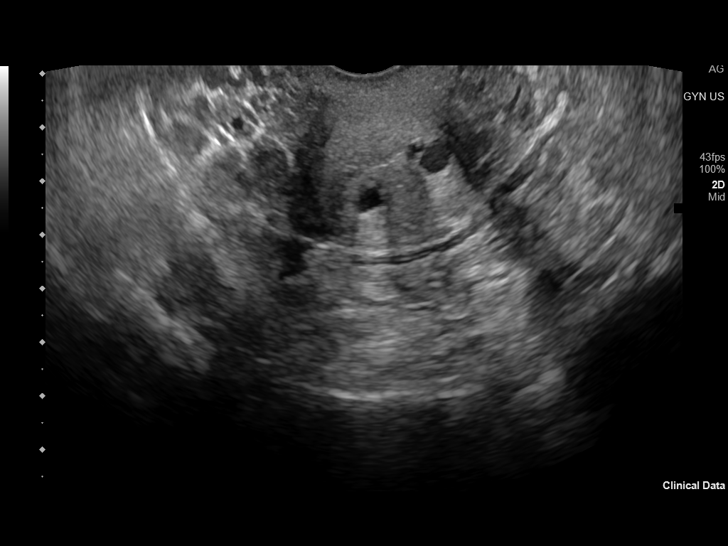
[im 43/64]
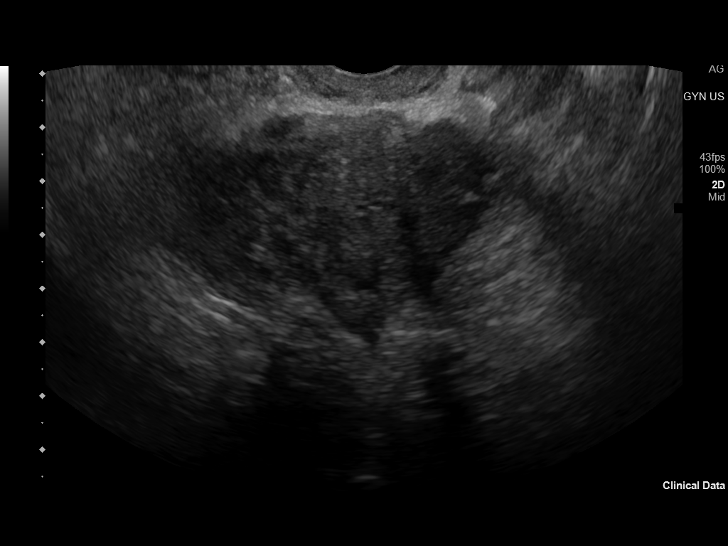
[im 48/64]
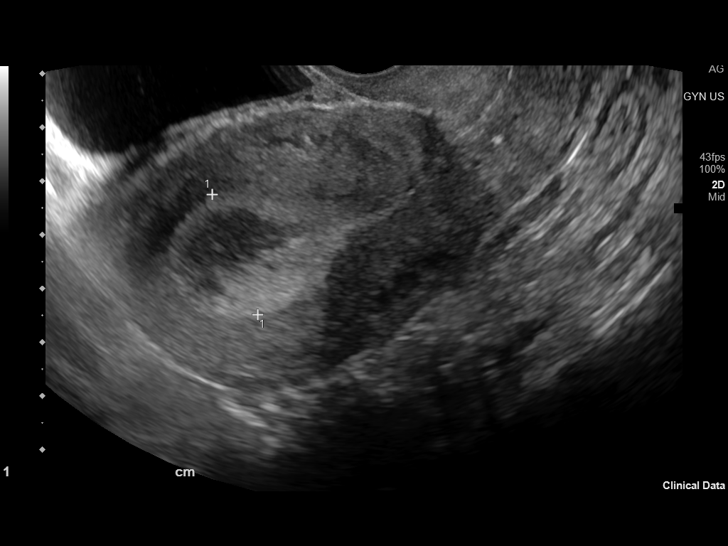
[im 53/64]
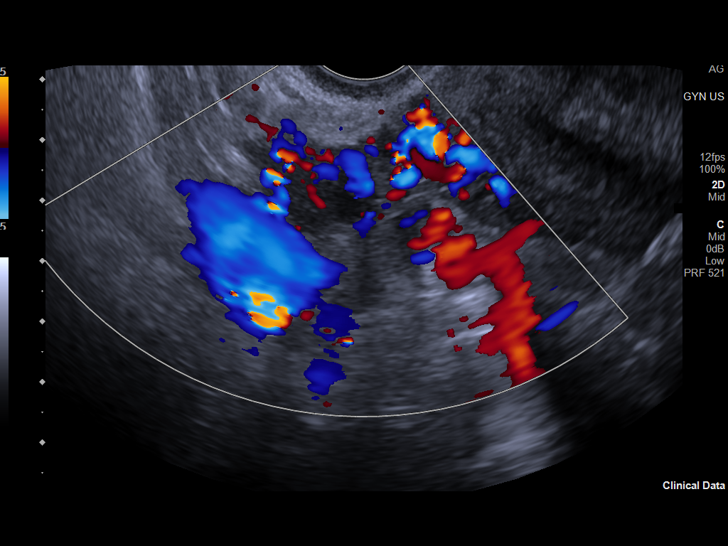
[im 58/64]
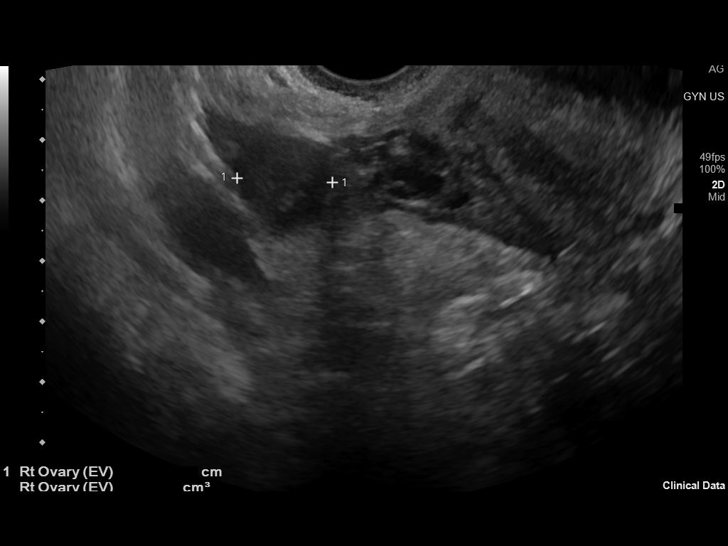
[im 64/64]
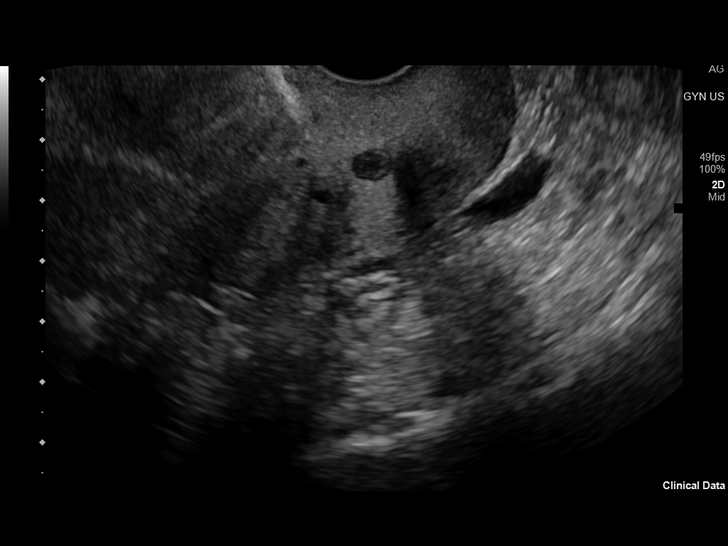

[13 of 25 positions shown; findings below may reference images not displayed]

FINDINGS: Uterus

Measurements: 9.1 x 4.5 x 6.9 cm = volume: 146 mL. Uterus is
anteverted. Heterogeneous echotexture within the uterine myometrium
without discrete fibroid or other mass. Few small nabothian cysts
noted about the cervix.

Endometrium

Endometrial complex is heterogeneous and thickened up to 24 mm. No
visible focal abnormality or associated vascularity. Possible
superimposed complex hypoechoic fluid within the endometrial cavity.

Right ovary

Measurements: 2.7 x 2.2 x 1.6 cm = volume: 4.9 mL. Normal
appearance/no adnexal mass.

Left ovary

Measurements: 3.4 x 1.7 x 2.9 cm = volume: 8.4 mL. Normal
appearance/no adnexal mass.

Other findings

Trace free fluid seen within the pelvis, likely physiologic.
IMPRESSION: 1. Thickened and heterogeneous endometrial stripe measuring up to 24
mm. If bleeding remains unresponsive to hormonal or medical therapy,
focal lesion work-up with sonohysterogram should be considered.
Endometrial biopsy should also be considered in pre-menopausal
patients at high risk for endometrial carcinoma. (Ref: Radiological
Reasoning: Algorithmic Workup of Abnormal Vaginal Bleeding with
Endovaginal Sonography and Sonohysterography. AJR [id number]; [id number]).
2. Normal sonographic appearance of the uterus and ovaries. No
adnexal mass.
